# Patient Record
Sex: Female | Born: 1965 | Race: White | Hispanic: No | Marital: Single | Smoking: Former smoker
Health system: Southern US, Community
[De-identification: ages and names within clinical notes are randomized; demographics above are authoritative.]

## PROBLEM LIST (undated history)

## (undated) DIAGNOSIS — J45909 Unspecified asthma, uncomplicated: Secondary | ICD-10-CM

## (undated) DIAGNOSIS — F32A Depression, unspecified: Secondary | ICD-10-CM

## (undated) DIAGNOSIS — T7840XA Allergy, unspecified, initial encounter: Secondary | ICD-10-CM

## (undated) HISTORY — PX: KNEE SURGERY: SHX244

## (undated) HISTORY — PX: ABLATION: SHX5711

## (undated) HISTORY — DX: Unspecified asthma, uncomplicated: J45.909

## (undated) HISTORY — DX: Depression, unspecified: F32.A

## (undated) HISTORY — DX: Allergy, unspecified, initial encounter: T78.40XA

---

## 2005-01-01 ENCOUNTER — Encounter: Admission: RE | Admit: 2005-01-01 | Discharge: 2005-01-01 | Payer: Self-pay | Admitting: Family Medicine

## 2005-05-28 ENCOUNTER — Encounter: Admission: RE | Admit: 2005-05-28 | Discharge: 2005-05-28 | Payer: Self-pay | Admitting: Family Medicine

## 2005-06-02 ENCOUNTER — Encounter: Admission: RE | Admit: 2005-06-02 | Discharge: 2005-06-02 | Payer: Self-pay | Admitting: Interventional Radiology

## 2005-07-01 ENCOUNTER — Encounter: Admission: RE | Admit: 2005-07-01 | Discharge: 2005-07-01 | Payer: Self-pay | Admitting: Interventional Radiology

## 2009-01-22 ENCOUNTER — Encounter: Admission: RE | Admit: 2009-01-22 | Discharge: 2009-01-22 | Payer: Self-pay | Admitting: Family Medicine

## 2012-01-15 ENCOUNTER — Other Ambulatory Visit: Payer: Self-pay | Admitting: Obstetrics and Gynecology

## 2012-01-15 DIAGNOSIS — Z1231 Encounter for screening mammogram for malignant neoplasm of breast: Secondary | ICD-10-CM

## 2012-02-02 ENCOUNTER — Ambulatory Visit: Payer: Self-pay

## 2012-02-15 ENCOUNTER — Ambulatory Visit: Payer: Self-pay

## 2012-04-26 ENCOUNTER — Ambulatory Visit
Admission: RE | Admit: 2012-04-26 | Discharge: 2012-04-26 | Disposition: A | Discharge status: Home / Self Care | Payer: Commercial Indemnity | Source: Ambulatory Visit | Attending: Obstetrics and Gynecology | Admitting: Obstetrics and Gynecology

## 2012-04-26 DIAGNOSIS — Z1231 Encounter for screening mammogram for malignant neoplasm of breast: Secondary | ICD-10-CM

## 2015-08-27 DIAGNOSIS — E559 Vitamin D deficiency, unspecified: Secondary | ICD-10-CM | POA: Diagnosis not present

## 2015-08-27 DIAGNOSIS — R5383 Other fatigue: Secondary | ICD-10-CM | POA: Diagnosis not present

## 2015-08-27 DIAGNOSIS — J0100 Acute maxillary sinusitis, unspecified: Secondary | ICD-10-CM | POA: Diagnosis not present

## 2015-08-27 DIAGNOSIS — E538 Deficiency of other specified B group vitamins: Secondary | ICD-10-CM | POA: Diagnosis not present

## 2015-08-28 DIAGNOSIS — S83206A Unspecified tear of unspecified meniscus, current injury, right knee, initial encounter: Secondary | ICD-10-CM | POA: Diagnosis not present

## 2015-09-26 DIAGNOSIS — M23321 Other meniscus derangements, posterior horn of medial meniscus, right knee: Secondary | ICD-10-CM | POA: Diagnosis not present

## 2015-09-26 DIAGNOSIS — G8918 Other acute postprocedural pain: Secondary | ICD-10-CM | POA: Diagnosis not present

## 2015-09-26 DIAGNOSIS — M2241 Chondromalacia patellae, right knee: Secondary | ICD-10-CM | POA: Diagnosis not present

## 2015-09-26 DIAGNOSIS — S83241A Other tear of medial meniscus, current injury, right knee, initial encounter: Secondary | ICD-10-CM | POA: Diagnosis not present

## 2015-09-26 DIAGNOSIS — M94261 Chondromalacia, right knee: Secondary | ICD-10-CM | POA: Diagnosis not present

## 2015-10-04 DIAGNOSIS — M2241 Chondromalacia patellae, right knee: Secondary | ICD-10-CM | POA: Diagnosis not present

## 2015-10-04 DIAGNOSIS — M25661 Stiffness of right knee, not elsewhere classified: Secondary | ICD-10-CM | POA: Diagnosis not present

## 2015-10-04 DIAGNOSIS — M25561 Pain in right knee: Secondary | ICD-10-CM | POA: Diagnosis not present

## 2015-10-16 DIAGNOSIS — M25661 Stiffness of right knee, not elsewhere classified: Secondary | ICD-10-CM | POA: Diagnosis not present

## 2015-10-16 DIAGNOSIS — M25651 Stiffness of right hip, not elsewhere classified: Secondary | ICD-10-CM | POA: Diagnosis not present

## 2015-10-21 DIAGNOSIS — M25661 Stiffness of right knee, not elsewhere classified: Secondary | ICD-10-CM | POA: Diagnosis not present

## 2015-10-21 DIAGNOSIS — M25561 Pain in right knee: Secondary | ICD-10-CM | POA: Diagnosis not present

## 2015-10-21 DIAGNOSIS — Z9889 Other specified postprocedural states: Secondary | ICD-10-CM | POA: Diagnosis not present

## 2015-10-24 DIAGNOSIS — M25561 Pain in right knee: Secondary | ICD-10-CM | POA: Diagnosis not present

## 2015-10-24 DIAGNOSIS — M25661 Stiffness of right knee, not elsewhere classified: Secondary | ICD-10-CM | POA: Diagnosis not present

## 2015-10-30 DIAGNOSIS — M25561 Pain in right knee: Secondary | ICD-10-CM | POA: Diagnosis not present

## 2015-10-30 DIAGNOSIS — M25661 Stiffness of right knee, not elsewhere classified: Secondary | ICD-10-CM | POA: Diagnosis not present

## 2015-11-01 DIAGNOSIS — M25561 Pain in right knee: Secondary | ICD-10-CM | POA: Diagnosis not present

## 2015-11-01 DIAGNOSIS — M25661 Stiffness of right knee, not elsewhere classified: Secondary | ICD-10-CM | POA: Diagnosis not present

## 2015-11-15 DIAGNOSIS — M25561 Pain in right knee: Secondary | ICD-10-CM | POA: Diagnosis not present

## 2015-11-15 DIAGNOSIS — M25661 Stiffness of right knee, not elsewhere classified: Secondary | ICD-10-CM | POA: Diagnosis not present

## 2015-11-26 DIAGNOSIS — M25661 Stiffness of right knee, not elsewhere classified: Secondary | ICD-10-CM | POA: Diagnosis not present

## 2015-11-26 DIAGNOSIS — Z0000 Encounter for general adult medical examination without abnormal findings: Secondary | ICD-10-CM | POA: Diagnosis not present

## 2015-11-26 DIAGNOSIS — Z13220 Encounter for screening for lipoid disorders: Secondary | ICD-10-CM | POA: Diagnosis not present

## 2015-11-26 DIAGNOSIS — Z6835 Body mass index (BMI) 35.0-35.9, adult: Secondary | ICD-10-CM | POA: Diagnosis not present

## 2015-11-26 DIAGNOSIS — I10 Essential (primary) hypertension: Secondary | ICD-10-CM | POA: Diagnosis not present

## 2015-11-26 DIAGNOSIS — R5383 Other fatigue: Secondary | ICD-10-CM | POA: Diagnosis not present

## 2015-11-26 DIAGNOSIS — Z131 Encounter for screening for diabetes mellitus: Secondary | ICD-10-CM | POA: Diagnosis not present

## 2015-11-26 DIAGNOSIS — E559 Vitamin D deficiency, unspecified: Secondary | ICD-10-CM | POA: Diagnosis not present

## 2015-11-26 DIAGNOSIS — M25561 Pain in right knee: Secondary | ICD-10-CM | POA: Diagnosis not present

## 2015-12-02 DIAGNOSIS — M25561 Pain in right knee: Secondary | ICD-10-CM | POA: Diagnosis not present

## 2015-12-02 DIAGNOSIS — M25661 Stiffness of right knee, not elsewhere classified: Secondary | ICD-10-CM | POA: Diagnosis not present

## 2016-01-28 DIAGNOSIS — Z01419 Encounter for gynecological examination (general) (routine) without abnormal findings: Secondary | ICD-10-CM | POA: Diagnosis not present

## 2016-02-01 ENCOUNTER — Other Ambulatory Visit: Payer: Self-pay | Admitting: Obstetrics and Gynecology

## 2016-02-01 DIAGNOSIS — Z1231 Encounter for screening mammogram for malignant neoplasm of breast: Secondary | ICD-10-CM

## 2016-03-04 DIAGNOSIS — Z23 Encounter for immunization: Secondary | ICD-10-CM | POA: Diagnosis not present

## 2016-03-13 ENCOUNTER — Ambulatory Visit: Payer: Commercial Indemnity

## 2016-04-29 ENCOUNTER — Ambulatory Visit
Admission: RE | Admit: 2016-04-29 | Discharge: 2016-04-29 | Disposition: A | Discharge status: Home / Self Care | Payer: BLUE CROSS/BLUE SHIELD | Source: Ambulatory Visit | Attending: Obstetrics and Gynecology | Admitting: Obstetrics and Gynecology

## 2016-04-29 DIAGNOSIS — Z1231 Encounter for screening mammogram for malignant neoplasm of breast: Secondary | ICD-10-CM | POA: Diagnosis not present

## 2016-07-08 DIAGNOSIS — E611 Iron deficiency: Secondary | ICD-10-CM | POA: Diagnosis not present

## 2016-07-08 DIAGNOSIS — R5382 Chronic fatigue, unspecified: Secondary | ICD-10-CM | POA: Diagnosis not present

## 2016-07-08 DIAGNOSIS — E559 Vitamin D deficiency, unspecified: Secondary | ICD-10-CM | POA: Diagnosis not present

## 2016-10-08 DIAGNOSIS — J45909 Unspecified asthma, uncomplicated: Secondary | ICD-10-CM | POA: Diagnosis not present

## 2016-10-27 DIAGNOSIS — H6507 Acute serous otitis media, recurrent, unspecified ear: Secondary | ICD-10-CM | POA: Diagnosis not present

## 2016-12-01 DIAGNOSIS — E559 Vitamin D deficiency, unspecified: Secondary | ICD-10-CM | POA: Diagnosis not present

## 2016-12-01 DIAGNOSIS — Z13220 Encounter for screening for lipoid disorders: Secondary | ICD-10-CM | POA: Diagnosis not present

## 2016-12-01 DIAGNOSIS — I10 Essential (primary) hypertension: Secondary | ICD-10-CM | POA: Diagnosis not present

## 2016-12-01 DIAGNOSIS — Z0000 Encounter for general adult medical examination without abnormal findings: Secondary | ICD-10-CM | POA: Diagnosis not present

## 2016-12-09 DIAGNOSIS — J310 Chronic rhinitis: Secondary | ICD-10-CM | POA: Diagnosis not present

## 2017-02-04 DIAGNOSIS — I83813 Varicose veins of bilateral lower extremities with pain: Secondary | ICD-10-CM | POA: Diagnosis not present

## 2017-02-09 DIAGNOSIS — Z124 Encounter for screening for malignant neoplasm of cervix: Secondary | ICD-10-CM | POA: Diagnosis not present

## 2017-02-09 DIAGNOSIS — Z01419 Encounter for gynecological examination (general) (routine) without abnormal findings: Secondary | ICD-10-CM | POA: Diagnosis not present

## 2017-02-09 DIAGNOSIS — Z1151 Encounter for screening for human papillomavirus (HPV): Secondary | ICD-10-CM | POA: Diagnosis not present

## 2017-03-29 DIAGNOSIS — Z23 Encounter for immunization: Secondary | ICD-10-CM | POA: Diagnosis not present

## 2017-06-04 DIAGNOSIS — F900 Attention-deficit hyperactivity disorder, predominantly inattentive type: Secondary | ICD-10-CM | POA: Diagnosis not present

## 2017-06-04 DIAGNOSIS — I10 Essential (primary) hypertension: Secondary | ICD-10-CM | POA: Diagnosis not present

## 2017-06-04 DIAGNOSIS — J4530 Mild persistent asthma, uncomplicated: Secondary | ICD-10-CM | POA: Diagnosis not present

## 2017-06-04 DIAGNOSIS — F331 Major depressive disorder, recurrent, moderate: Secondary | ICD-10-CM | POA: Diagnosis not present

## 2017-07-26 ENCOUNTER — Other Ambulatory Visit: Payer: Self-pay | Admitting: Obstetrics and Gynecology

## 2017-07-26 DIAGNOSIS — Z1231 Encounter for screening mammogram for malignant neoplasm of breast: Secondary | ICD-10-CM

## 2017-08-10 ENCOUNTER — Ambulatory Visit
Admission: RE | Admit: 2017-08-10 | Discharge: 2017-08-10 | Disposition: A | Discharge status: Home / Self Care | Payer: BLUE CROSS/BLUE SHIELD | Source: Ambulatory Visit | Attending: Obstetrics and Gynecology | Admitting: Obstetrics and Gynecology

## 2017-08-10 DIAGNOSIS — Z1231 Encounter for screening mammogram for malignant neoplasm of breast: Secondary | ICD-10-CM

## 2018-01-03 DIAGNOSIS — Z23 Encounter for immunization: Secondary | ICD-10-CM | POA: Diagnosis not present

## 2018-01-03 DIAGNOSIS — Z0000 Encounter for general adult medical examination without abnormal findings: Secondary | ICD-10-CM | POA: Diagnosis not present

## 2018-01-06 DIAGNOSIS — I10 Essential (primary) hypertension: Secondary | ICD-10-CM | POA: Diagnosis not present

## 2018-01-06 DIAGNOSIS — E669 Obesity, unspecified: Secondary | ICD-10-CM | POA: Diagnosis not present

## 2018-01-06 DIAGNOSIS — E559 Vitamin D deficiency, unspecified: Secondary | ICD-10-CM | POA: Diagnosis not present

## 2018-03-09 DIAGNOSIS — Z5181 Encounter for therapeutic drug level monitoring: Secondary | ICD-10-CM | POA: Diagnosis not present

## 2018-03-09 DIAGNOSIS — F331 Major depressive disorder, recurrent, moderate: Secondary | ICD-10-CM | POA: Diagnosis not present

## 2018-04-15 DIAGNOSIS — I10 Essential (primary) hypertension: Secondary | ICD-10-CM | POA: Diagnosis not present

## 2018-04-15 DIAGNOSIS — J019 Acute sinusitis, unspecified: Secondary | ICD-10-CM | POA: Diagnosis not present

## 2018-05-25 DIAGNOSIS — Z01419 Encounter for gynecological examination (general) (routine) without abnormal findings: Secondary | ICD-10-CM | POA: Diagnosis not present

## 2018-06-01 DIAGNOSIS — R591 Generalized enlarged lymph nodes: Secondary | ICD-10-CM | POA: Diagnosis not present

## 2018-06-06 DIAGNOSIS — R143 Flatulence: Secondary | ICD-10-CM | POA: Diagnosis not present

## 2018-06-06 DIAGNOSIS — K219 Gastro-esophageal reflux disease without esophagitis: Secondary | ICD-10-CM | POA: Diagnosis not present

## 2019-01-20 DIAGNOSIS — Z6835 Body mass index (BMI) 35.0-35.9, adult: Secondary | ICD-10-CM | POA: Diagnosis not present

## 2019-01-20 DIAGNOSIS — Z0000 Encounter for general adult medical examination without abnormal findings: Secondary | ICD-10-CM | POA: Diagnosis not present

## 2019-01-20 DIAGNOSIS — Z23 Encounter for immunization: Secondary | ICD-10-CM | POA: Diagnosis not present

## 2019-01-20 DIAGNOSIS — Z713 Dietary counseling and surveillance: Secondary | ICD-10-CM | POA: Diagnosis not present

## 2019-01-20 DIAGNOSIS — E559 Vitamin D deficiency, unspecified: Secondary | ICD-10-CM | POA: Diagnosis not present

## 2019-01-20 DIAGNOSIS — I10 Essential (primary) hypertension: Secondary | ICD-10-CM | POA: Diagnosis not present

## 2019-02-17 ENCOUNTER — Other Ambulatory Visit: Payer: Self-pay

## 2019-02-17 DIAGNOSIS — Z20828 Contact with and (suspected) exposure to other viral communicable diseases: Secondary | ICD-10-CM | POA: Diagnosis not present

## 2019-02-17 DIAGNOSIS — Z20822 Contact with and (suspected) exposure to covid-19: Secondary | ICD-10-CM

## 2019-02-19 LAB — NOVEL CORONAVIRUS, NAA: SARS-CoV-2, NAA: NOT DETECTED

## 2019-03-14 ENCOUNTER — Other Ambulatory Visit: Payer: Self-pay | Admitting: Family Medicine

## 2019-03-14 DIAGNOSIS — Z1231 Encounter for screening mammogram for malignant neoplasm of breast: Secondary | ICD-10-CM

## 2019-06-01 DIAGNOSIS — H9203 Otalgia, bilateral: Secondary | ICD-10-CM | POA: Diagnosis not present

## 2019-06-14 ENCOUNTER — Other Ambulatory Visit: Payer: Self-pay | Admitting: Family Medicine

## 2019-06-14 DIAGNOSIS — Z1231 Encounter for screening mammogram for malignant neoplasm of breast: Secondary | ICD-10-CM

## 2019-06-15 ENCOUNTER — Other Ambulatory Visit: Payer: Self-pay

## 2019-06-15 ENCOUNTER — Ambulatory Visit
Admission: RE | Admit: 2019-06-15 | Discharge: 2019-06-15 | Disposition: A | Discharge status: Home / Self Care | Payer: BC Managed Care – PPO | Source: Ambulatory Visit

## 2019-06-15 DIAGNOSIS — Z1231 Encounter for screening mammogram for malignant neoplasm of breast: Secondary | ICD-10-CM

## 2019-07-21 DIAGNOSIS — Z1151 Encounter for screening for human papillomavirus (HPV): Secondary | ICD-10-CM | POA: Diagnosis not present

## 2019-07-21 DIAGNOSIS — Z01419 Encounter for gynecological examination (general) (routine) without abnormal findings: Secondary | ICD-10-CM | POA: Diagnosis not present

## 2019-07-21 DIAGNOSIS — Z124 Encounter for screening for malignant neoplasm of cervix: Secondary | ICD-10-CM | POA: Diagnosis not present

## 2019-09-05 DIAGNOSIS — S76311A Strain of muscle, fascia and tendon of the posterior muscle group at thigh level, right thigh, initial encounter: Secondary | ICD-10-CM | POA: Diagnosis not present

## 2020-01-16 DIAGNOSIS — Z20828 Contact with and (suspected) exposure to other viral communicable diseases: Secondary | ICD-10-CM | POA: Diagnosis not present

## 2020-01-22 DIAGNOSIS — Z23 Encounter for immunization: Secondary | ICD-10-CM | POA: Diagnosis not present

## 2020-01-22 DIAGNOSIS — Z0000 Encounter for general adult medical examination without abnormal findings: Secondary | ICD-10-CM | POA: Diagnosis not present

## 2020-01-30 DIAGNOSIS — E559 Vitamin D deficiency, unspecified: Secondary | ICD-10-CM | POA: Diagnosis not present

## 2020-01-30 DIAGNOSIS — I10 Essential (primary) hypertension: Secondary | ICD-10-CM | POA: Diagnosis not present

## 2020-01-30 DIAGNOSIS — E785 Hyperlipidemia, unspecified: Secondary | ICD-10-CM | POA: Diagnosis not present

## 2020-06-24 ENCOUNTER — Other Ambulatory Visit: Payer: Self-pay | Admitting: Family Medicine

## 2020-06-24 DIAGNOSIS — Z1231 Encounter for screening mammogram for malignant neoplasm of breast: Secondary | ICD-10-CM

## 2020-07-23 ENCOUNTER — Ambulatory Visit
Admission: RE | Admit: 2020-07-23 | Discharge: 2020-07-23 | Disposition: A | Discharge status: Home / Self Care | Payer: BC Managed Care – PPO | Source: Ambulatory Visit

## 2020-07-23 ENCOUNTER — Other Ambulatory Visit: Payer: Self-pay

## 2020-07-23 DIAGNOSIS — Z1231 Encounter for screening mammogram for malignant neoplasm of breast: Secondary | ICD-10-CM

## 2020-08-01 DIAGNOSIS — Z01419 Encounter for gynecological examination (general) (routine) without abnormal findings: Secondary | ICD-10-CM | POA: Diagnosis not present

## 2020-09-26 DIAGNOSIS — S060X0A Concussion without loss of consciousness, initial encounter: Secondary | ICD-10-CM | POA: Diagnosis not present

## 2021-01-03 DIAGNOSIS — M1711 Unilateral primary osteoarthritis, right knee: Secondary | ICD-10-CM | POA: Diagnosis not present

## 2021-01-27 DIAGNOSIS — Z79899 Other long term (current) drug therapy: Secondary | ICD-10-CM | POA: Diagnosis not present

## 2021-01-27 DIAGNOSIS — I10 Essential (primary) hypertension: Secondary | ICD-10-CM | POA: Diagnosis not present

## 2021-01-27 DIAGNOSIS — E559 Vitamin D deficiency, unspecified: Secondary | ICD-10-CM | POA: Diagnosis not present

## 2021-01-30 DIAGNOSIS — Z0000 Encounter for general adult medical examination without abnormal findings: Secondary | ICD-10-CM | POA: Diagnosis not present

## 2021-01-30 DIAGNOSIS — E559 Vitamin D deficiency, unspecified: Secondary | ICD-10-CM | POA: Diagnosis not present

## 2021-01-31 DIAGNOSIS — M1711 Unilateral primary osteoarthritis, right knee: Secondary | ICD-10-CM | POA: Diagnosis not present

## 2021-04-21 DIAGNOSIS — F900 Attention-deficit hyperactivity disorder, predominantly inattentive type: Secondary | ICD-10-CM | POA: Diagnosis not present

## 2021-04-21 DIAGNOSIS — F5104 Psychophysiologic insomnia: Secondary | ICD-10-CM | POA: Diagnosis not present

## 2021-04-21 DIAGNOSIS — F331 Major depressive disorder, recurrent, moderate: Secondary | ICD-10-CM | POA: Diagnosis not present

## 2021-05-22 ENCOUNTER — Encounter: Payer: Self-pay | Admitting: Gastroenterology

## 2021-06-23 ENCOUNTER — Other Ambulatory Visit: Payer: Self-pay

## 2021-06-23 ENCOUNTER — Ambulatory Visit (AMBULATORY_SURGERY_CENTER): Payer: BC Managed Care – PPO | Admitting: *Deleted

## 2021-06-23 VITALS — Ht 67.0 in | Wt 210.0 lb

## 2021-06-23 DIAGNOSIS — Z1211 Encounter for screening for malignant neoplasm of colon: Secondary | ICD-10-CM

## 2021-06-23 MED ORDER — NA SULFATE-K SULFATE-MG SULF 17.5-3.13-1.6 GM/177ML PO SOLN: 1.0000 | Freq: Once | ORAL | 0 refills | Status: AC

## 2021-06-23 NOTE — Progress Notes (Signed)

## 2021-07-02 DIAGNOSIS — F331 Major depressive disorder, recurrent, moderate: Secondary | ICD-10-CM | POA: Diagnosis not present

## 2021-07-02 DIAGNOSIS — G4700 Insomnia, unspecified: Secondary | ICD-10-CM | POA: Diagnosis not present

## 2021-07-02 DIAGNOSIS — F900 Attention-deficit hyperactivity disorder, predominantly inattentive type: Secondary | ICD-10-CM | POA: Diagnosis not present

## 2021-07-03 ENCOUNTER — Encounter: Payer: Self-pay | Admitting: Gastroenterology

## 2021-07-07 ENCOUNTER — Encounter: Payer: Self-pay | Admitting: Gastroenterology

## 2021-07-07 ENCOUNTER — Ambulatory Visit (AMBULATORY_SURGERY_CENTER): Payer: BC Managed Care – PPO | Admitting: Gastroenterology

## 2021-07-07 ENCOUNTER — Other Ambulatory Visit: Payer: Self-pay

## 2021-07-07 VITALS — BP 123/73 | HR 72 | Temp 98.0°F | Resp 13 | Ht 67.0 in | Wt 210.0 lb

## 2021-07-07 DIAGNOSIS — Z800 Family history of malignant neoplasm of digestive organs: Secondary | ICD-10-CM | POA: Diagnosis not present

## 2021-07-07 DIAGNOSIS — D128 Benign neoplasm of rectum: Secondary | ICD-10-CM

## 2021-07-07 DIAGNOSIS — D123 Benign neoplasm of transverse colon: Secondary | ICD-10-CM

## 2021-07-07 DIAGNOSIS — Z1211 Encounter for screening for malignant neoplasm of colon: Secondary | ICD-10-CM | POA: Diagnosis not present

## 2021-07-07 MED ORDER — SODIUM CHLORIDE 0.9 % IV SOLN: 500.0000 mL | Freq: Once | INTRAVENOUS | Status: DC

## 2021-07-07 NOTE — Progress Notes (Signed)
Pt's states no medical or surgical changes since previsit or office visit.   VS taken by DT 

## 2021-07-07 NOTE — Op Note (Signed)
[place] Patient Name: Phyllis Holt Procedure Date: 07/07/2021 10:35 AM MRN: [id number] Endoscopist: Milus Banister , MD Age: 56 Referring MD:  Date of Birth: Sep 02, 1965 Gender: Female Account #: [id number] Procedure:                Colonoscopy Indications:              Screening in patient at increased risk: Mother                            diagnosed with CRC in her early to mid 25s. Medicines:                Monitored Anesthesia Care Procedure:                Pre-Anesthesia Assessment:                           - Prior to the procedure, a History and Physical                            was performed, and patient medications and                            allergies were reviewed. The patient's tolerance of                            previous anesthesia was also reviewed. The risks                            and benefits of the procedure and the sedation                            options and risks were discussed with the patient.                            All questions were answered, and informed consent                            was obtained. Prior Anticoagulants: The patient has                            taken no previous anticoagulant or antiplatelet                            agents. ASA Grade Assessment: II - A patient with                            mild systemic disease. After reviewing the risks                            and benefits, the patient was deemed in                            satisfactory condition to undergo the procedure.  After obtaining informed consent, the colonoscope                            was passed under direct vision. Throughout the                            procedure, the patient's blood pressure, pulse, and                            oxygen saturations were monitored continuously. The                            CF [id number] #[id number] was introduced through the anus                            and advanced  to the the cecum, identified by                            appendiceal orifice and ileocecal valve. The                            colonoscopy was performed without difficulty. The                            patient tolerated the procedure well. The quality                            of the bowel preparation was good. The ileocecal                            valve, appendiceal orifice, and rectum were                            photographed. Scope In: 10:48:44 AM Scope Out: 10:58:58 AM Scope Withdrawal Time: 0 hours 7 minutes 45 seconds  Total Procedure Duration: 0 hours 10 minutes 14 seconds  Findings:                 Two sessile polyps were found in the rectum and                            transverse colon. The polyps were 6 to 9 mm in                            size. These polyps were removed with a cold snare.                            Resection and retrieval were complete.                           The exam was otherwise without abnormality on                            direct and retroflexion views. Complications:  No immediate complications. Estimated blood loss:                            None. Estimated Blood Loss:     Estimated blood loss: none. Impression:               - Two 6 to 9 mm polyps in the rectum and in the                            transverse colon, removed with a cold snare.                            Resected and retrieved.                           - The examination was otherwise normal on direct                            and retroflexion views. Recommendation:           - Patient has a contact number available for                            emergencies. The signs and symptoms of potential                            delayed complications were discussed with the                            patient. Return to normal activities tomorrow.                            Written discharge instructions were provided to the                            patient.                            - Resume previous diet.                           - Continue present medications.                           - Await pathology results. Milus Banister, MD 07/07/2021 11:01:34 AM This report has been signed electronically.

## 2021-07-07 NOTE — Progress Notes (Signed)
HPI: This is a woman with FH of CRC, mother had colon cancer   ROS: complete GI ROS as described in HPI, all other review negative.  Constitutional:  No unintentional weight loss   Past Medical History:  Diagnosis Date   Allergy    SEASONAL   Asthma    Depression     Past Surgical History:  Procedure Laterality Date   ABLATION     2015   KNEE SURGERY Right    MENISCUS REPAIR-2017    Current Outpatient Medications  Medication Sig Dispense Refill   albuterol (PROVENTIL) (2.5 MG/3ML) 0.083% nebulizer solution as needed.     albuterol (VENTOLIN HFA) 108 (90 Base) MCG/ACT inhaler as needed.     amphetamine-dextroamphetamine (ADDERALL) 30 MG tablet Take 1 tablet by mouth 2 (two) times daily.     buPROPion (WELLBUTRIN XL) 300 MG 24 hr tablet 1 tablet     Cholecalciferol (VITAMIN D3) 1.25 MG (50000 UT) CAPS Take by mouth daily.     Coenzyme Q10 10 MG capsule Take 10 mg by mouth daily.     COLLAGEN PO Take by mouth once a week. Take 2 times a week     Cyanocobalamin (EQL VITAMIN B-12 PO) Take by mouth. TAKING 5000 MCG WITH FOLATE-800 MCG     FLUoxetine (PROZAC) 20 MG capsule daily.     Magnesium 300 MG CAPS daily.     mometasone-formoterol (DULERA) 200-5 MCG/ACT AERO 2 puffs     Multiple Vitamins-Minerals (MULTIVITAMIN ADULTS 50+ PO) Take by mouth daily. Ultra potency complete multivitamin     norgestimate-ethinyl estradiol (ORTHO-CYCLEN) 0.25-35 MG-MCG tablet 1 tablet     Omega-3 Fatty Acids (OMEGA 3 PO) Take by mouth.     OVER THE COUNTER MEDICATION 500 mg. Tumeric take one capsule     traZODone (DESYREL) 50 MG tablet 1 tablet at bedtime as needed     tretinoin (RETIN-A) 0.05 % cream as needed.     No current facility-administered medications for this visit.    Allergies as of 07/07/2021 - Review Complete 07/07/2021  Allergen Reaction Noted   Penicillins Other (See Comments) 03/29/2014   Sulfa antibiotics Rash 03/29/2014    Family History  Problem Relation Age of Onset    Rectal cancer Mother    Colon cancer Mother    Breast cancer Neg Hx    Stomach cancer Neg Hx     Social History   Socioeconomic History   Marital status: Single    Spouse name: Not on file   Number of children: Not on file   Years of education: Not on file   Highest education level: Not on file  Occupational History   Not on file  Tobacco Use   Smoking status: Former    Types: Cigarettes    Quit date: 2013    Years since quitting: 10.1    Passive exposure: Never   Smokeless tobacco: Never  Vaping Use   Vaping Use: Never used  Substance and Sexual Activity   Alcohol use: Yes    Comment: 3 GLASSES PER WEEK   Drug use: Never   Sexual activity: Not on file  Other Topics Concern   Not on file  Social History Narrative   Not on file   Social Determinants of Health   Financial Resource Strain: Not on file  Food Insecurity: Not on file  Transportation Needs: Not on file  Physical Activity: Not on file  Stress: Not on file  Social Connections: Not on file  Intimate Partner Violence: Not on file     Physical Exam:  Constitutional: generally well-appearing Psychiatric: alert and oriented x3 Lungs: CTA bilaterally Heart: no MCR  Assessment and plan: 56 y.o. female with FH of CRC, mother  Screening colonoscopy today  Care is appropriate for the ambulatory setting.  Owens Loffler, MD [facility] 07/07/2021, 10:16 AM

## 2021-07-07 NOTE — Patient Instructions (Signed)
Handout provided on polyps.   YOU HAD AN ENDOSCOPIC PROCEDURE TODAY AT THE [facility]:   Refer to the procedure report that was given to you for any specific questions about what was found during the examination.  If the procedure report does not answer your questions, please call your gastroenterologist to clarify.  If you requested that your care partner not be given the details of your procedure findings, then the procedure report has been included in a sealed envelope for you to review at your convenience later.  YOU SHOULD EXPECT: Some feelings of bloating in the abdomen. Passage of more gas than usual.  Walking can help get rid of the air that was put into your GI tract during the procedure and reduce the bloating. If you had a lower endoscopy (such as a colonoscopy or flexible sigmoidoscopy) you may notice spotting of blood in your stool or on the toilet paper. If you underwent a bowel prep for your procedure, you may not have a normal bowel movement for a few days.  Please Note:  You might notice some irritation and congestion in your nose or some drainage.  This is from the oxygen used during your procedure.  There is no need for concern and it should clear up in a day or so.  SYMPTOMS TO REPORT IMMEDIATELY:  Following lower endoscopy (colonoscopy or flexible sigmoidoscopy):  Excessive amounts of blood in the stool  Significant tenderness or worsening of abdominal pains  Swelling of the abdomen that is new, acute  Fever of 100F or higher  For urgent or emergent issues, a gastroenterologist can be reached at any hour by calling [phone]. Do not use MyChart messaging for urgent concerns.    DIET:  We do recommend a small meal at first, but then you may proceed to your regular diet.  Drink plenty of fluids but you should avoid alcoholic beverages for 24 hours.  ACTIVITY:  You should plan to take it easy for the rest of today and you should NOT DRIVE or use heavy  machinery until tomorrow (because of the sedation medicines used during the test).    FOLLOW UP: Our staff will call the number listed on your records 48-72 hours following your procedure to check on you and address any questions or concerns that you may have regarding the information given to you following your procedure. If we do not reach you, we will leave a message.  We will attempt to reach you two times.  During this call, we will ask if you have developed any symptoms of COVID 19. If you develop any symptoms (ie: fever, flu-like symptoms, shortness of breath, cough etc.) before then, please call [phone].  If you test positive for Covid 19 in the 2 weeks post procedure, please call and report this information to us.    If any biopsies were taken you will be contacted by phone or by letter within the next 1-3 weeks.  Please call us at [phone] if you have not heard about the biopsies in 3 weeks.    SIGNATURES/CONFIDENTIALITY: You and/or your care partner have signed paperwork which will be entered into your electronic medical record.  These signatures attest to the fact that that the information above on your After Visit Summary has been reviewed and is understood.  Full responsibility of the confidentiality of this discharge information lies with you and/or your care-partner.  

## 2021-07-07 NOTE — Progress Notes (Signed)
To Pacu, VSS. Report to [email or web address] 

## 2021-07-07 NOTE — Progress Notes (Signed)
Called to room to assist during endoscopic procedure.  Patient ID and intended procedure confirmed with present staff. Received instructions for my participation in the procedure from the performing physician.  

## 2021-07-09 ENCOUNTER — Telehealth: Payer: Self-pay | Admitting: *Deleted

## 2021-07-09 ENCOUNTER — Telehealth: Payer: Self-pay

## 2021-07-09 NOTE — Telephone Encounter (Signed)
?  Follow up Call- ? ?Call back number 07/07/2021  ?Post procedure Call Back phone  # [phone]  ?Permission to leave phone message Yes  ?Some recent data might be hidden  ?  ? ?Patient questions: ? ?Do you have a fever, pain , or abdominal swelling? No. ?Pain Score  0 * ? ?Have you tolerated food without any problems? Yes.   ? ?Have you been able to return to your normal activities? Yes.   ? ?Do you have any questions about your discharge instructions: ?Diet   No. ?Medications  No. ?Follow up visit  No. ? ?Do you have questions or concerns about your Care? No. ? ?Actions: ?* If pain score is 4 or above: ?No action needed, pain <4.\ ? ? ? ?Have you developed a fever since your procedure? no ? ?2.   Have you had an respiratory symptoms (SOB or cough) since your procedure? no ? ?3.   Have you tested positive for COVID 19 since your procedure no ? ?4.   Have you had any family members/close contacts diagnosed with the COVID 19 since your procedure?  no ? ? ?If yes to any of these questions please route to Joylene John, RN and Joella Prince, RN ? ? ? ?

## 2021-07-09 NOTE — Telephone Encounter (Signed)
First follow up call attempt.  LVM. 

## 2021-07-14 ENCOUNTER — Encounter: Payer: Self-pay | Admitting: Gastroenterology

## 2021-07-22 ENCOUNTER — Other Ambulatory Visit: Payer: Self-pay | Admitting: Family Medicine

## 2021-07-22 DIAGNOSIS — Z1231 Encounter for screening mammogram for malignant neoplasm of breast: Secondary | ICD-10-CM

## 2021-07-28 ENCOUNTER — Ambulatory Visit
Admission: RE | Admit: 2021-07-28 | Discharge: 2021-07-28 | Disposition: A | Discharge status: Home / Self Care | Payer: BC Managed Care – PPO | Source: Ambulatory Visit | Attending: Family Medicine | Admitting: Family Medicine

## 2021-07-28 ENCOUNTER — Other Ambulatory Visit: Payer: Self-pay

## 2021-07-28 DIAGNOSIS — Z1231 Encounter for screening mammogram for malignant neoplasm of breast: Secondary | ICD-10-CM

## 2021-08-27 DIAGNOSIS — Z01419 Encounter for gynecological examination (general) (routine) without abnormal findings: Secondary | ICD-10-CM | POA: Diagnosis not present

## 2021-08-27 DIAGNOSIS — Z01411 Encounter for gynecological examination (general) (routine) with abnormal findings: Secondary | ICD-10-CM | POA: Diagnosis not present

## 2021-08-27 DIAGNOSIS — R232 Flushing: Secondary | ICD-10-CM | POA: Diagnosis not present

## 2021-08-27 DIAGNOSIS — N941 Unspecified dyspareunia: Secondary | ICD-10-CM | POA: Diagnosis not present

## 2021-11-20 DIAGNOSIS — R21 Rash and other nonspecific skin eruption: Secondary | ICD-10-CM | POA: Diagnosis not present

## 2021-11-20 DIAGNOSIS — F331 Major depressive disorder, recurrent, moderate: Secondary | ICD-10-CM | POA: Diagnosis not present

## 2021-11-20 DIAGNOSIS — R2241 Localized swelling, mass and lump, right lower limb: Secondary | ICD-10-CM | POA: Diagnosis not present

## 2021-12-30 DIAGNOSIS — G4700 Insomnia, unspecified: Secondary | ICD-10-CM | POA: Diagnosis not present

## 2021-12-30 DIAGNOSIS — Z23 Encounter for immunization: Secondary | ICD-10-CM | POA: Diagnosis not present

## 2021-12-30 DIAGNOSIS — F900 Attention-deficit hyperactivity disorder, predominantly inattentive type: Secondary | ICD-10-CM | POA: Diagnosis not present

## 2022-02-02 DIAGNOSIS — Z23 Encounter for immunization: Secondary | ICD-10-CM | POA: Diagnosis not present

## 2022-02-02 DIAGNOSIS — Z0000 Encounter for general adult medical examination without abnormal findings: Secondary | ICD-10-CM | POA: Diagnosis not present

## 2022-02-02 DIAGNOSIS — F331 Major depressive disorder, recurrent, moderate: Secondary | ICD-10-CM | POA: Diagnosis not present

## 2022-02-02 DIAGNOSIS — Z13220 Encounter for screening for lipoid disorders: Secondary | ICD-10-CM | POA: Diagnosis not present

## 2022-02-02 DIAGNOSIS — F900 Attention-deficit hyperactivity disorder, predominantly inattentive type: Secondary | ICD-10-CM | POA: Diagnosis not present

## 2022-05-09 IMAGING — MG MM DIGITAL SCREENING BILAT W/ TOMO AND CAD
6 of 10 series · 6 of 30 positions shown · non-contrast
Comparison: Previous exam(s).

CLINICAL DATA: Screening.

EXAM:
DIGITAL SCREENING BILATERAL MAMMOGRAM WITH TOMOSYNTHESIS AND CAD
TECHNIQUE: Bilateral screening digital craniocaudal and mediolateral oblique
mammograms were obtained. Bilateral screening digital breast
tomosynthesis was performed. The images were evaluated with
computer-aided detection.

[R MLO synth-2D]
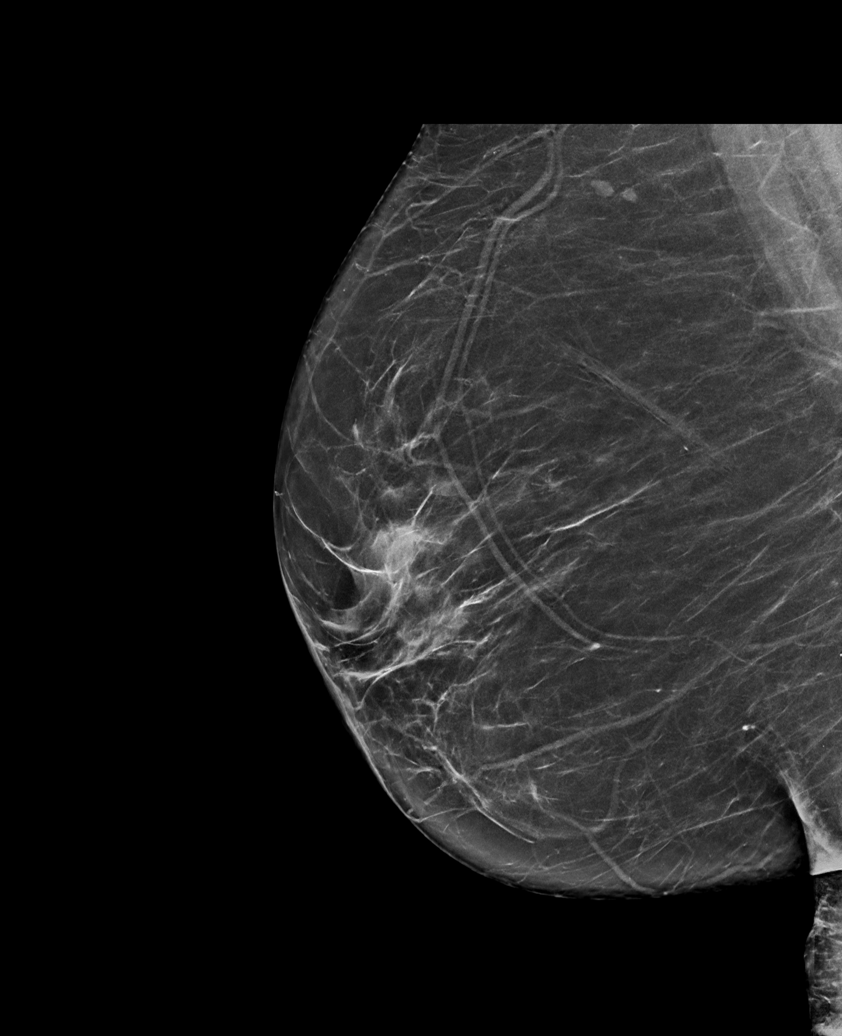

[R CV synth-2D]
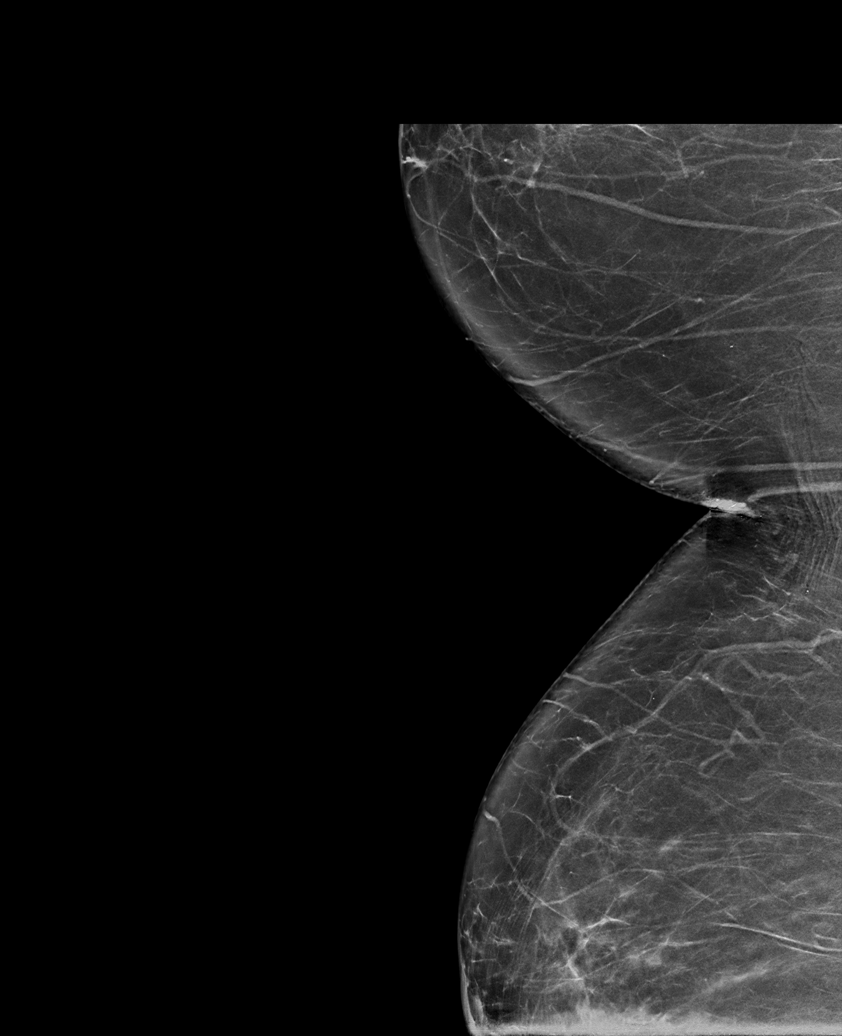

[L CC synth-2D]
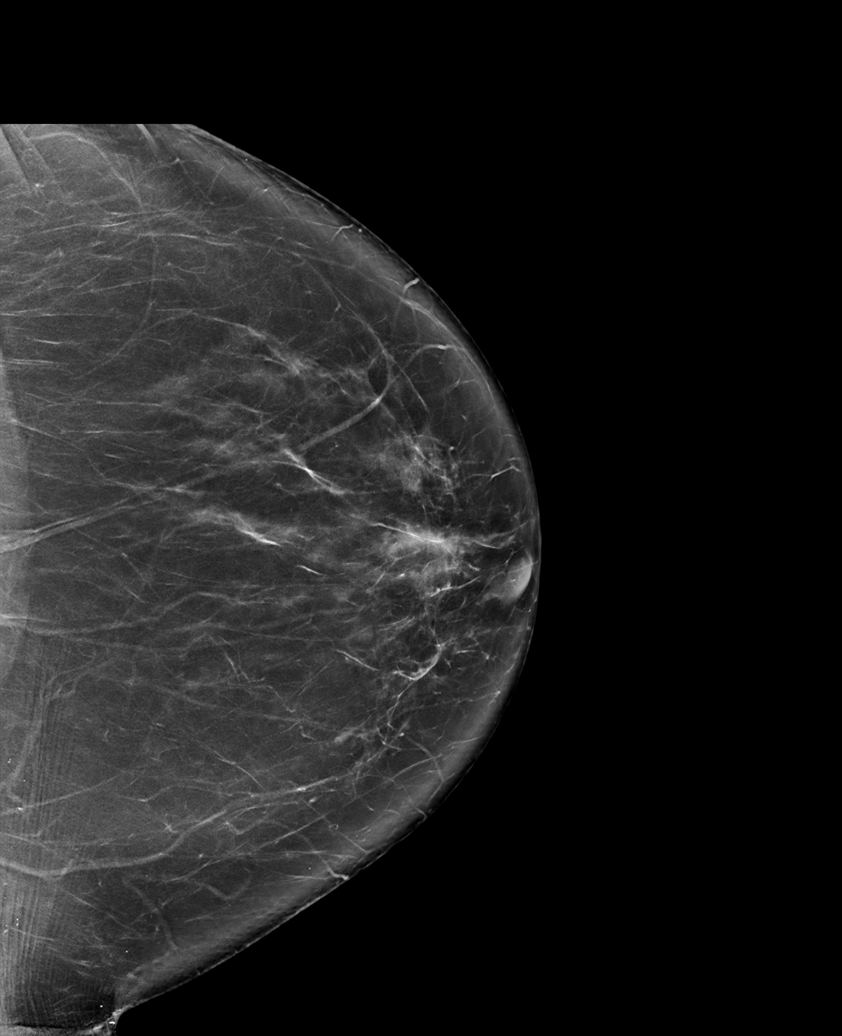

[R CC synth-2D]
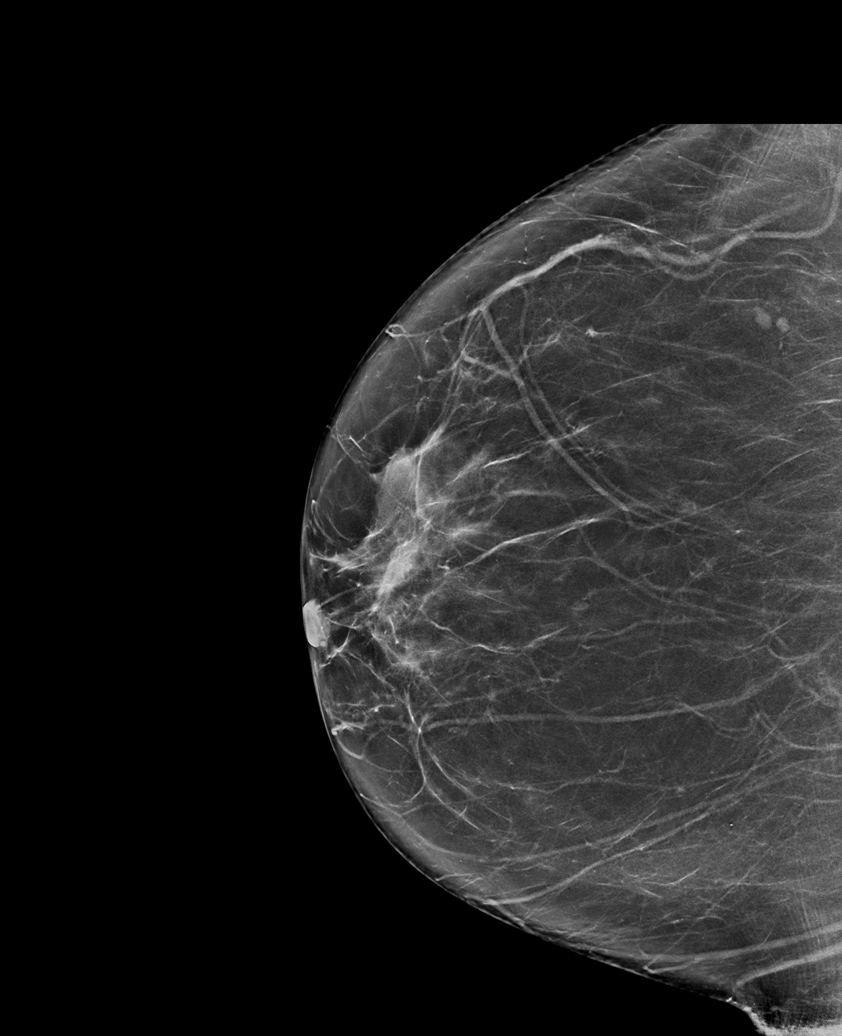

[L MLO synth-2D]
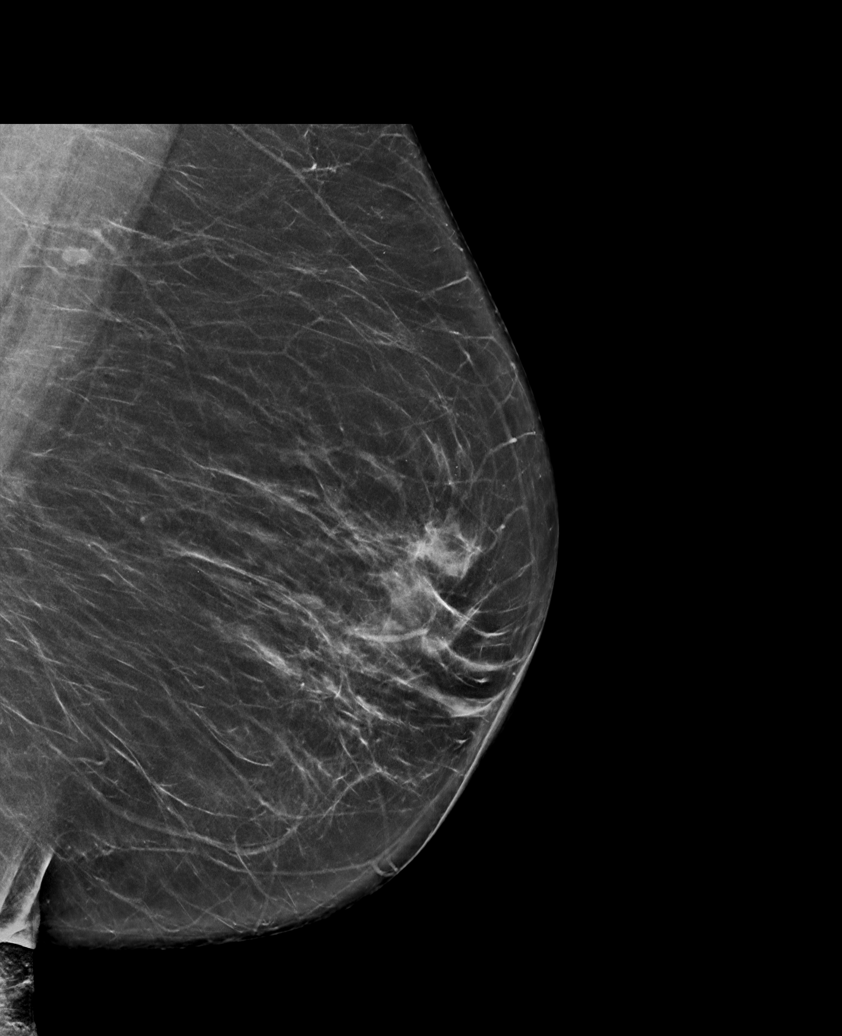

[R CV tomo · tomo slice 39/77.0]
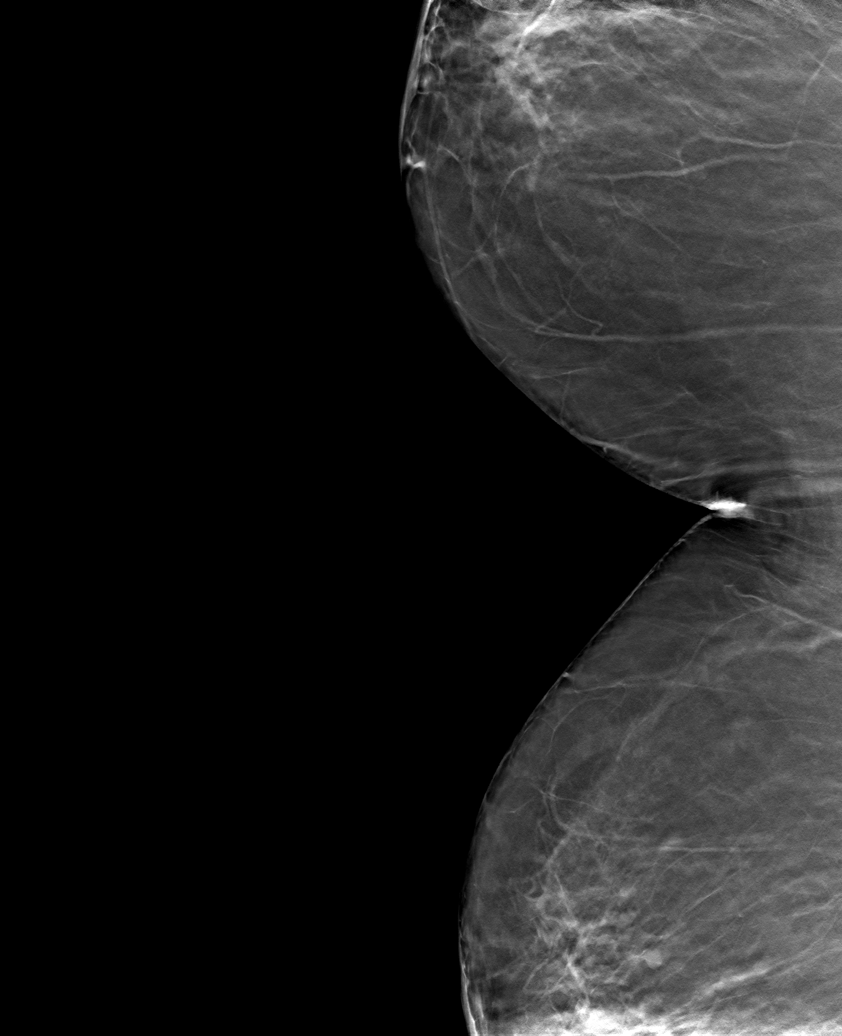

[6 of 30 positions shown; findings below may reference images not displayed]

ACR Breast Density Category b: There are scattered areas of
fibroglandular density.
FINDINGS: There are no findings suspicious for malignancy.
IMPRESSION: No mammographic evidence of malignancy. A result letter of this
screening mammogram will be mailed directly to the patient.

RECOMMENDATION:
Screening mammogram in one year. (Code:51-O-LD2)

BI-RADS CATEGORY  1: Negative.

## 2022-05-13 ENCOUNTER — Other Ambulatory Visit: Payer: Self-pay | Admitting: Family Medicine

## 2022-05-13 DIAGNOSIS — Z1231 Encounter for screening mammogram for malignant neoplasm of breast: Secondary | ICD-10-CM

## 2022-07-30 ENCOUNTER — Ambulatory Visit
Admission: RE | Admit: 2022-07-30 | Discharge: 2022-07-30 | Disposition: A | Discharge status: Home / Self Care | Payer: No Typology Code available for payment source | Source: Ambulatory Visit

## 2022-07-30 DIAGNOSIS — Z1231 Encounter for screening mammogram for malignant neoplasm of breast: Secondary | ICD-10-CM

## 2023-07-06 ENCOUNTER — Other Ambulatory Visit: Payer: Self-pay | Admitting: Family Medicine

## 2023-07-06 DIAGNOSIS — Z0000 Encounter for general adult medical examination without abnormal findings: Secondary | ICD-10-CM

## 2023-08-04 ENCOUNTER — Ambulatory Visit
Admission: RE | Admit: 2023-08-04 | Discharge: 2023-08-04 | Disposition: A | Discharge status: Home / Self Care | Source: Ambulatory Visit | Attending: Family Medicine | Admitting: Family Medicine

## 2023-08-04 DIAGNOSIS — Z0000 Encounter for general adult medical examination without abnormal findings: Secondary | ICD-10-CM

## 2023-08-10 ENCOUNTER — Ambulatory Visit (HOSPITAL_COMMUNITY)
Admission: RE | Admit: 2023-08-10 | Discharge: 2023-08-10 | Disposition: A | Discharge status: Home / Self Care | Source: Ambulatory Visit | Attending: Cardiology | Admitting: Cardiology

## 2023-08-10 ENCOUNTER — Other Ambulatory Visit (HOSPITAL_COMMUNITY): Payer: Self-pay | Admitting: Orthopedic Surgery

## 2023-08-10 DIAGNOSIS — M79605 Pain in left leg: Secondary | ICD-10-CM | POA: Insufficient documentation

## 2023-08-10 DIAGNOSIS — M7989 Other specified soft tissue disorders: Secondary | ICD-10-CM | POA: Diagnosis present
